# Patient Record
Sex: Female | Born: 1985 | Race: Black or African American | Hispanic: No | Marital: Single | Smoking: Never smoker
Health system: Southern US, Community
[De-identification: ages and names within clinical notes are randomized; demographics above are authoritative.]

---

## 2016-07-08 ENCOUNTER — Emergency Department (HOSPITAL_BASED_OUTPATIENT_CLINIC_OR_DEPARTMENT_OTHER): Payer: Self-pay

## 2016-07-08 ENCOUNTER — Encounter (HOSPITAL_BASED_OUTPATIENT_CLINIC_OR_DEPARTMENT_OTHER): Payer: Self-pay

## 2016-07-08 ENCOUNTER — Emergency Department (HOSPITAL_BASED_OUTPATIENT_CLINIC_OR_DEPARTMENT_OTHER)
Admission: EM | Admit: 2016-07-08 | Discharge: 2016-07-08 | Disposition: A | Discharge status: Home / Self Care | Payer: Self-pay | Attending: Emergency Medicine | Admitting: Emergency Medicine

## 2016-07-08 DIAGNOSIS — Y92410 Unspecified street and highway as the place of occurrence of the external cause: Secondary | ICD-10-CM | POA: Insufficient documentation

## 2016-07-08 DIAGNOSIS — M542 Cervicalgia: Secondary | ICD-10-CM | POA: Insufficient documentation

## 2016-07-08 DIAGNOSIS — Z791 Long term (current) use of non-steroidal anti-inflammatories (NSAID): Secondary | ICD-10-CM | POA: Insufficient documentation

## 2016-07-08 DIAGNOSIS — Y999 Unspecified external cause status: Secondary | ICD-10-CM | POA: Insufficient documentation

## 2016-07-08 DIAGNOSIS — R6884 Jaw pain: Secondary | ICD-10-CM | POA: Insufficient documentation

## 2016-07-08 DIAGNOSIS — M25512 Pain in left shoulder: Secondary | ICD-10-CM | POA: Insufficient documentation

## 2016-07-08 DIAGNOSIS — R319 Hematuria, unspecified: Secondary | ICD-10-CM | POA: Insufficient documentation

## 2016-07-08 DIAGNOSIS — Y9389 Activity, other specified: Secondary | ICD-10-CM | POA: Insufficient documentation

## 2016-07-08 DIAGNOSIS — S99912A Unspecified injury of left ankle, initial encounter: Secondary | ICD-10-CM | POA: Insufficient documentation

## 2016-07-08 LAB — URINALYSIS, ROUTINE W REFLEX MICROSCOPIC
Bilirubin Urine: NEGATIVE
Glucose, UA: NEGATIVE mg/dL
Ketones, ur: NEGATIVE mg/dL
LEUKOCYTES UA: NEGATIVE
NITRITE: NEGATIVE
PROTEIN: NEGATIVE mg/dL
Specific Gravity, Urine: 1.028 (ref 1.005–1.030)
pH: 6 (ref 5.0–8.0)

## 2016-07-08 LAB — URINE MICROSCOPIC-ADD ON

## 2016-07-08 LAB — PREGNANCY, URINE: Preg Test, Ur: NEGATIVE

## 2016-07-08 MED ORDER — IBUPROFEN 800 MG PO TABS: 800.0000 mg | ORAL_TABLET | Freq: Three times a day (TID) | ORAL | 0 refills | Status: AC

## 2016-07-08 MED ORDER — ORPHENADRINE CITRATE ER 100 MG PO TB12: 100.0000 mg | ORAL_TABLET | Freq: Two times a day (BID) | ORAL | 0 refills | Status: AC | PRN

## 2016-07-08 MED ORDER — IBUPROFEN 800 MG PO TABS
800.0000 mg | ORAL_TABLET | Freq: Once | ORAL | Status: AC
  Administered 2016-07-08: 800 mg via ORAL
  Filled 2016-07-08: qty 1

## 2016-07-08 MED ORDER — FLUCONAZOLE 50 MG PO TABS
150.0000 mg | ORAL_TABLET | Freq: Every day | ORAL | Status: DC
  Administered 2016-07-08: 150 mg via ORAL
  Filled 2016-07-08: qty 1
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
## 2016-07-08 NOTE — Discharge Instructions (Signed)
Medications: Ibuprofen, Norflex  Treatment: Take ibuprofen every 8 hours as prescribed. Take Norflex every 12 hours as needed for muscle pain and soreness. Do not drive or operate machinery when taking this medication. Use moist heat on your sore muscles 3-4 times daily alternating 20 minutes on, 20 minutes off. You can use ice on your jaw 3-4 times daily alternating 20 minutes on, 20 minutes off. Make sure to stay as active as possible to prevent your muscles from tightening up.  Follow-up: Please follow-up with your OB/GYN in 2 weeks for recheck of your urine. Please return to the emergency department if you develop any new or worsening symptoms.
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
## 2023-03-10 ENCOUNTER — Telehealth: Payer: Self-pay

## 2023-03-10 NOTE — Telephone Encounter (Signed)
Patient called after hours line to cancel her appointment. I cancelled her appointment and called her to see if she wanted to reschedule. Twice she picked up and I couldn't hear anyone talking.

## 2023-03-12 ENCOUNTER — Encounter: Payer: Medicaid Other | Admitting: Family Medicine
